# Patient Record
Sex: Female | Born: 1985 | Race: White | Hispanic: No | Marital: Single | Smoking: Never smoker
Health system: Southern US, Community
[De-identification: ages and names within clinical notes are randomized; demographics above are authoritative.]

## PROBLEM LIST (undated history)

## (undated) DIAGNOSIS — Q213 Tetralogy of Fallot: Secondary | ICD-10-CM

## (undated) HISTORY — PX: CARDIAC SURGERY: SHX584

---

## 2013-08-17 ENCOUNTER — Emergency Department
Admission: EM | Admit: 2013-08-17 | Discharge: 2013-08-17 | Disposition: A | Discharge status: Home / Self Care | Payer: BC Managed Care – PPO | Source: Home / Self Care | Attending: Emergency Medicine | Admitting: Emergency Medicine

## 2013-08-17 ENCOUNTER — Encounter: Payer: Self-pay | Admitting: Emergency Medicine

## 2013-08-17 DIAGNOSIS — L0201 Cutaneous abscess of face: Secondary | ICD-10-CM

## 2013-08-17 DIAGNOSIS — K047 Periapical abscess without sinus: Secondary | ICD-10-CM

## 2013-08-17 HISTORY — DX: Tetralogy of Fallot: Q21.3

## 2013-08-17 MED ORDER — AMOXICILLIN-POT CLAVULANATE 875-125 MG PO TABS: 1.0000 | ORAL_TABLET | Freq: Two times a day (BID) | ORAL | Status: DC

## 2013-08-17 MED ORDER — CEFTRIAXONE SODIUM 1 G IJ SOLR
1.0000 g | INTRAMUSCULAR | Status: AC
  Administered 2013-08-17: 1 g via INTRAMUSCULAR

## 2013-08-17 NOTE — ED Provider Notes (Addendum)
CSN: [id number]     Arrival date & time 08/17/13  1206 History   First MD Initiated Contact with Patient 08/17/13 1249     Chief Complaint  Patient presents with  . Dental Pain   HPI 27 year old female,  from [place], visiting [place] with family.  Reports onset of pain and swelling in upper left anterior  jaw x 3 days. She feels that she has abscessed tooth. It is Friday during the holiday week, and no dentist available.  No OTCs since yesterday.-- She's tried saltwater gargles. Denies drainage or bleeding. Denies fever, chills, chest pain, shortness of breath. She denies chance of pregnancy, LMP normal about 2 weeks ago. She has IUD in place. She is allergic to Vicodin which caused "weird symptoms" when she took this in the past for acute postop pain. She states she's not seeking any controlled drugs pain medication. The pain is moderate intensity.  Ibuprofen or Tylenol helps somewhat.  Past Medical History  Diagnosis Date  . TOF (tetralogy of Fallot)    Past Surgical History  Procedure Laterality Date  . Cardiac surgery      x3   History reviewed. No pertinent family history. History  Substance Use Topics  . Smoking status: Never Smoker   . Smokeless tobacco: Not on file  . Alcohol Use: Yes   OB History   Grav Para Term Preterm Abortions TAB SAB Ect Mult Living                 Review of Systems  Constitutional: Negative for fever, chills and fatigue.  HENT: Negative for congestion, rhinorrhea, sinus pressure, sore throat and trouble swallowing.   Respiratory: Negative for cough, chest tightness and wheezing.   Cardiovascular: Negative for chest pain, palpitations and leg swelling.  Gastrointestinal: Negative.   Genitourinary: Negative.   Neurological: Negative.   Psychiatric/Behavioral: Negative.   All other systems reviewed and are negative.    Allergies  Vicodin  Home Medications   Current Outpatient Rx  Name  Route  Sig  Dispense  Refill  .  amoxicillin-clavulanate (AUGMENTIN) 875-125 MG per tablet   Oral   Take 1 tablet by mouth every 12 (twelve) hours. Take for 10 days. Take with food.   20 tablet   0    BP 102/66  Pulse 88  Temp(Src) 98.7 F (37.1 C) (Oral)  Resp 16  Ht 5\' 3"  (1.6 m)  Wt 125 lb (56.7 kg)  BMI 22.15 kg/m2  SpO2 100% Physical Exam  Nursing note and vitals reviewed. Constitutional: She is oriented to person, place, and time. She appears well-developed and well-nourished. She is cooperative.  Non-toxic appearance. No distress.  HENT:  Head: Normocephalic and atraumatic.  Right Ear: Tympanic membrane, external ear and ear canal normal.  Left Ear: Tympanic membrane, external ear and ear canal normal.  Nose: Nose normal. Right sinus exhibits no maxillary sinus tenderness and no frontal sinus tenderness. Left sinus exhibits no maxillary sinus tenderness and no frontal sinus tenderness.  Mouth/Throat: Mucous membranes are normal. No oropharyngeal exudate, posterior oropharyngeal edema, posterior oropharyngeal erythema or tonsillar abscesses.    There is induration, redness, mild swelling as depicted. No fluctuance or drainage.  Eyes: Conjunctivae are normal. No scleral icterus.  Neck: Neck supple.  Cardiovascular: Normal rate and regular rhythm.   Murmur heard.  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  Extremities: No cyanosis, clubbing, or edema  Pulmonary/Chest: Effort normal and breath sounds normal. No stridor. No respiratory distress. She has no wheezes. She has no rales.  Musculoskeletal: She exhibits no edema.  Lymphadenopathy:       Head (left side): Submandibular adenopathy present.    She has cervical adenopathy.       Right cervical: No superficial cervical, no deep cervical and no posterior cervical adenopathy present.      Left cervical: Superficial cervical adenopathy  present. No deep cervical and no posterior cervical adenopathy present.  Neurological: She is alert and oriented to person, place, and time.  Skin: Skin is warm and dry. No rash noted.  No red streaks  Psychiatric: She has a normal mood and affect.    ED Course  Procedures (including critical care time) Labs Review Labs Reviewed - No data to display Imaging Review No results found.  EKG Interpretation    Date/Time:    Ventricular Rate:    PR Interval:    QRS Duration:   QT Interval:    QTC Calculation:   R Axis:     Text Interpretation:              MDM   1. Cellulitis and abscess of face   2. Dental abscess    Risks, benefits, alternatives discussed Rocephin 1 g IM stat. Augmentin 875 twice a day x10 days. She is not requesting any prescription pain medication. Advised ibuprofen OTC 600 mg every 6-8 hours with food for pain, as this has been effective. Continue salt water rinses . Warm compresses. No dentist office or oral surgeon office in this region is open today  (Fri 12/26).   I advised that she needs to followup with an oral surgeon or ENT within 3-5 days.  Names and phone numbers given. --- Red flags discussed. Go to ER stat if any severe or worsening symptoms. Precautions discussed. Red flags discussed. Questions invited and answered. Patient voiced understanding and agreement.    Lajean Manes, MD 08/17/13 1603  Lajean Manes, MD 08/17/13 [phone]

## 2013-08-17 NOTE — ED Notes (Signed)
Reports onset of pain in upper left jaw x 3 days ago; believes there is an abscessed tooth there. Is from out of town with no dental care available. No OTCs since yesterday.

## 2014-01-23 ENCOUNTER — Ambulatory Visit (INDEPENDENT_AMBULATORY_CARE_PROVIDER_SITE_OTHER): Payer: BC Managed Care – PPO | Admitting: Cardiovascular Disease

## 2014-01-23 ENCOUNTER — Encounter: Payer: Self-pay | Admitting: Cardiovascular Disease

## 2014-01-23 ENCOUNTER — Telehealth: Payer: Self-pay | Admitting: *Deleted

## 2014-01-23 VITALS — BP 122/76 | HR 84 | Ht 62.0 in | Wt 143.9 lb

## 2014-01-23 DIAGNOSIS — Z9889 Other specified postprocedural states: Secondary | ICD-10-CM

## 2014-01-23 DIAGNOSIS — E785 Hyperlipidemia, unspecified: Secondary | ICD-10-CM

## 2014-01-23 DIAGNOSIS — Z954 Presence of other heart-valve replacement: Secondary | ICD-10-CM

## 2014-01-23 DIAGNOSIS — R5381 Other malaise: Secondary | ICD-10-CM

## 2014-01-23 DIAGNOSIS — G4710 Hypersomnia, unspecified: Secondary | ICD-10-CM

## 2014-01-23 DIAGNOSIS — I4510 Unspecified right bundle-branch block: Secondary | ICD-10-CM

## 2014-01-23 DIAGNOSIS — Z79899 Other long term (current) drug therapy: Secondary | ICD-10-CM

## 2014-01-23 DIAGNOSIS — R5383 Other fatigue: Secondary | ICD-10-CM

## 2014-01-23 DIAGNOSIS — Z952 Presence of prosthetic heart valve: Secondary | ICD-10-CM

## 2014-01-23 DIAGNOSIS — Q213 Tetralogy of Fallot: Secondary | ICD-10-CM

## 2014-01-23 NOTE — Telephone Encounter (Signed)
Faxed [place] release of information to Dr. Laray Anger [facility] [place] to receive patient records.

## 2014-01-23 NOTE — Patient Instructions (Addendum)
Your physician recommends that you schedule a follow-up appointment in: 2 Months  Your physician has requested that you have an echocardiogram. Echocardiography is a painless test that uses sound waves to create images of your heart. It provides your doctor with information about the size and shape of your heart and how well your heart's chambers and valves are working. This procedure takes approximately one hour. There are no restrictions for this procedure.  A chest x-ray takes a picture of the organs and structures inside the chest, including the heart, lungs, and blood vessels. This test can show several things, including, whether the heart is enlarges; whether fluid is building up in the lungs; and whether pacemaker / defibrillator leads are still in place.  Your physician recommends that you return for lab work Fasting Blood work

## 2014-01-29 ENCOUNTER — Telehealth (HOSPITAL_COMMUNITY): Payer: Self-pay | Admitting: *Deleted

## 2014-02-08 ENCOUNTER — Ambulatory Visit (INDEPENDENT_AMBULATORY_CARE_PROVIDER_SITE_OTHER): Payer: BC Managed Care – PPO | Admitting: Family Medicine

## 2014-02-08 ENCOUNTER — Encounter: Payer: Self-pay | Admitting: Family Medicine

## 2014-02-08 VITALS — BP 112/65 | HR 91 | Ht 62.0 in | Wt 143.0 lb

## 2014-02-08 DIAGNOSIS — R0683 Snoring: Secondary | ICD-10-CM

## 2014-02-08 DIAGNOSIS — F411 Generalized anxiety disorder: Secondary | ICD-10-CM | POA: Insufficient documentation

## 2014-02-08 DIAGNOSIS — R0609 Other forms of dyspnea: Secondary | ICD-10-CM

## 2014-02-08 DIAGNOSIS — L819 Disorder of pigmentation, unspecified: Secondary | ICD-10-CM

## 2014-02-08 DIAGNOSIS — R0989 Other specified symptoms and signs involving the circulatory and respiratory systems: Secondary | ICD-10-CM

## 2014-02-08 DIAGNOSIS — Q213 Tetralogy of Fallot: Secondary | ICD-10-CM

## 2014-02-08 MED ORDER — ESCITALOPRAM OXALATE 5 MG PO TABS: 5.0000 mg | ORAL_TABLET | Freq: Every day | ORAL | Status: DC

## 2014-02-08 NOTE — Progress Notes (Signed)
CC: Stuti Loretta PlumeKennard is a 28 y.o. female is here for Establish Care   Subjective: HPI:  "Heidi Spencer" like "Santina EvansCatherine"  Very pleasant 28 year old here to establish care.  Complains of subjective anxiety that is described as mild to moderate in severity. She describes it as a uneasiness when around new environments. Also accompanied by a strong urge to go over lists that she already knows has committed to memory however she gets anxious that this possibly something she missed.  Also described as anxious this while driving. States his symptoms in the way of her quality of life, have been present for years, nothing particularly makes better or worse other than above. She was on anxiety medication when younger her she forgets what it was.  She denies obsessions, compulsions other than that described above, depression, nor any other mental disturbance  Has a history of tetralogy of Fallot.  She has already establish [place] cardi[place] in [place]. She denies exertional chest pain, orthopnea, peripheral edema. She tells me she does get nonrestorative sleep and is a snorer. She has never had any witnessed apneic episodes.  Reports a mole on the back of her left shoulder has been present for matter of years she's unsure whether or not it is getting bigger or smaller. No personal or family history of skin cancer. She has never had it checked by medical professional before.  Review of Systems - General ROS: negative for - chills, fever, night sweats, weight gain or weight loss Ophthalmic ROS: negative for - decreased vision Psychological ROS: negative for -  depression ENT ROS: negative for - hearing change, nasal congestion, tinnitus or allergies Hematological and Lymphatic ROS: negative for - bleeding problems, bruising or swollen lymph nodes Breast ROS: negative Respiratory ROS: no cough, shortness of breath, or wheezing Cardiovascular ROS: no chest pain or dyspnea on  exertion Gastrointestinal ROS: no abdominal pain, change in bowel habits, or black or bloody stools Genito-Urinary ROS: negative for - genital discharge, genital ulcers, incontinence or abnormal bleeding from genitals Musculoskeletal ROS: negative for - joint pain or muscle pain Neurological ROS: negative for - headaches or memory loss Dermatological ROS: negative for lumps, mole changes, rash and skin lesion changes other than that described above  Past Medical History  Diagnosis Date  . TOF (tetralogy of Fallot)     Past Surgical History  Procedure Laterality Date  . Cardiac surgery      x3 [age]yr, 28 yrs old and 28 yrs old   Family History  Problem Relation Age of Onset  . Crohn's disease Father   . Congestive Heart Failure Paternal Grandfather     History   Social History  . Marital Status: Single    Spouse Name: N/A    Number of Children: N/A  . Years of Education: N/A   Occupational History  . Not on file.   Social History Main Topics  . Smoking status: Never Smoker   . Smokeless tobacco: Never Used  . Alcohol Use: Yes  . Drug Use: No  . Sexual Activity: Not on file   Other Topics Concern  . Not on file   Social History Narrative  . No narrative on file     Objective: BP 112/65  Pulse 91  Ht 5\' 2"  (1.575 m)  Wt 143 lb (64.864 kg)  BMI 26.15 kg/m2   General: Alert and Oriented, No Acute Distress HEENT: Pupils equal, round, reactive to light. Conjunctivae clear. Moist mucous membranes pharynx unremarkable Lungs: Clear to  auscultation bilaterally, no wheezing/ronchi/rales.  Comfortable work of breathing. Good air movement. Cardiac: Regular rate and rhythm. Normal S1/S2.  No murmurs, rubs, nor gallops.   Extremities: No peripheral edema.  Strong peripheral pulses.  Mental Status: No depression, anxiety, nor agitation. Skin: Warm and dry. 4 mm diameter circular, symmetric, sharply bordered, single color pigmented lesion on the left shoulder,  flat.  Assessment & Plan: Valene was seen today for establish care.  Diagnoses and associated orders for this visit:  Tetralogy of Fallot  Pigmented skin lesion  Generalized anxiety disorder - escitalopram (LEXAPRO) 5 MG tablet; Take 1 tablet (5 mg total) by mouth daily.  Snoring    Pigmented skin lesion: Does not meet abcd criteria for biopsy at this time, we will follow at future visits Generalized anxiety disorder: Start low-dose Lexapro Snoring: I've asked her to ask her partner to watch her sleeping sometime in the near future to look for any apnea episodes.  Return in about 4 weeks (around 03/08/2014) for Mood Follow Up.

## 2014-02-16 ENCOUNTER — Encounter: Payer: Self-pay | Admitting: Cardiovascular Disease

## 2014-02-16 DIAGNOSIS — G4710 Hypersomnia, unspecified: Secondary | ICD-10-CM | POA: Insufficient documentation

## 2014-02-16 DIAGNOSIS — Z952 Presence of prosthetic heart valve: Secondary | ICD-10-CM | POA: Insufficient documentation

## 2014-02-16 DIAGNOSIS — I4510 Unspecified right bundle-branch block: Secondary | ICD-10-CM | POA: Insufficient documentation

## 2014-02-16 NOTE — Progress Notes (Signed)
Patient ID: Heidi Spencer, female   DOB: 10-03-1985, 28 y.o.   MRN: [id number]     PATIENT PROFILE: Heidi Spencer is a 28 y.o. female who presents to the office today to establish cardiology care with me.  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   HPI:  Heidi Spencer is a 28 y.o. 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 She tells me she underwent 3 open heart surgical procedures.  The first procedure was when she was [age] year old, the second procedure at age 89, and her last operation at age 28.  [facility]  She was cared for by a Dr. Marland KitchenHussayni as well as Dr. Fredderick PhenixJoel Hardin.  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  I do not have the specifics of any of her heart surgeries.  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  She now presents to the office to establish cardiology care.  Presently, she denies chest pain. She is able to walk 7 days per week.  She denies clear-cut exertional shortness of breath, but does admit to experiencing episodes of random, shortness of breath.  There is no history of palpitations, presyncope, or syncope.   She does admit to hypersomnolence.  She notes rare snoring.   Past Medical History  Diagnosis Date  . TOF (tetralogy of Fallot)     Past Surgical History  Procedure Laterality Date  . Cardiac surgery      x3 [age]yr, 28 yrs old and 28 yrs old    Allergies  Allergen Reactions  . Vicodin [Hydrocodone-Acetaminophen]     Pt states it makes her breathing irregular    Current Outpatient Prescriptions  Medication Sig Dispense Refill  .  escitalopram (LEXAPRO) 5 MG tablet Take 1 tablet (5 mg total) by mouth daily.  30 tablet  2   No current facility-administered medications for this visit.    Socially, she is single and has 2 children, ages 884 and 3916 months.  Family History  Problem Relation Age of Onset  . Crohn's disease Father   . Congestive Heart Failure Paternal Grandfather   . Depression      ROS General: Negative; No fevers, chills, or night sweats HEENT: Negative; No changes in vision or hearing, sinus congestion, difficulty swallowing Pulmonary: Negative; No cough, wheezing, shortness of breath, hemoptysis Cardiovascular:  See HPI; No chest pain, presyncope, syncope, palpitations, edema GI: Negative; No nausea, vomiting, diarrhea, or abdominal pain GU: Negative; No dysuria, hematuria, or difficulty voiding Musculoskeletal: Negative; no myalgias, joint pain, or weakness Hematologic/Oncologic: Negative; no easy bruising, bleeding Endocrine: Negative; no heat/cold intolerance; no diabetes Neuro: Negative; no changes in balance, headaches Skin: Negative; No rashes or skin lesions Psychiatric: Negative; No behavioral problems, depression Sleep: Positive for hypersomnolence.  She typically goes to bed at 10:30 PM and wakes up at 6 AM. bruxism, restless legs, hypnogagnic hallucinations Other comprehensive 14 point system review is negative   Physical Exam BP 122/76  Pulse 84  Ht 5\' 2"  (1.575 m)  Wt 143 lb 14.4 oz (65.273 kg)  BMI 26.31 kg/m2 General: Alert, oriented, no distress.  Skin: normal turgor, no rashes, warm and dry HEENT: Normocephalic, atraumatic. Pupils equal round and reactive to light; sclera anicteric; extraocular muscles intact; Fundi without hemorrhages or exudates Nose without nasal septal hypertrophy Mouth/Parynx benign; Mallinpatti scale 3 Neck: Transmitted murmur to her neck;  No JVD, no carotid bruits; normal carotid upstroke Lungs: clear to ausculatation and percussion; no wheezing  or rales Chest wall: without tenderness to palpitation Heart: PMI not displaced, RRR, s1 s2 normal, 4/6 systolic murmur along the left sternal border with transmitted murmurs to her left neck, no diastolic murmur, no rubs, gallops, thrills, or heaves Abdomen: soft, nontender; no hepatosplenomehaly, BS+; abdominal aorta nontender and not dilated by palpation. Back: no CVA tenderness Pulses 2+ Musculoskeletal: full range of motion, normal strength, no joint deformities Extremities: no clubbing cyanosis or edema, Homan's sign negative  Neurologic: grossly nonfocal; Cranial nerves grossly wnl Psychologic: Normal mood and affect   ECG (independently read by me): Normal sinus rhythm at 84 beats per minute.  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  QTc interval 479 ms.  LABS:  BMET No results found for this basename: na, k, cl, co2, glucose, bun, creatinine, calcium, gfrnonaa, gfraa     Hepatic Function Panel  No results found for this basename: prot, albumin, ast, alt, alkphos, bilitot, bilidir, ibili     CBC No results found for this basename: wbc, rbc, hgb, hct, plt, mcv, mch, mchc, rdw, neutrabs, lymphsabs, monoabs, eosabs, basosabs     BNP No results found for this basename: probnp    Lipid Panel  No results found for this basename: chol, trig, hdl, cholhdl, vldl, ldlcalc      RADIOLOGY: No results found.   ASSESSMENT AND PLAN: Heidi Spencer is a very pleasant 28 year old female who has a complex congenital heart disease history and was born with Tetralogy of Fallot heart defect.  I need to obtain records of her prior hospitalizations and surgeries.  However, in speaking with her she has had 3 open heart surgical procedures and had a Blalock shunt.  She subsequently underwent  pulmonic valve replacement and had a bovine pericardial valve replaced at age 28, which is 12 years ago.  We will contact [facility] to obtain records from her children's  hospitalizations and also will contact Dr. Dorethea ClanHardin who was her adult congenital heart physician. I am scheduling her for a 2-D echo Doppler study to obtain a baseline and will need to compare this present study to her prior evaluations in [place].  A complete set of blood work will be obtained consisting of a CBC, comprehensive metabolic panel, TSH, vitamin D level, and lipid panel.  I am scheduling her for a PA and lateral chest x-ray. She does have hypersomnolence and Epworth Sleepiness Scale score today was calculated and was significantly elevated at 19.  She is unaware of experiencing any cataplectic spells and is unaware of any history of narcolepsy.  I will see her back in followup of the above studies and further recommendations will be made at that time.  Lennette Biharihomas A. Kelly, MD, [facility] 02/16/2014 2:30 PM

## 2014-02-18 ENCOUNTER — Ambulatory Visit (HOSPITAL_COMMUNITY)
Admission: RE | Admit: 2014-02-18 | Discharge: 2014-02-18 | Disposition: A | Discharge status: Home / Self Care | Payer: BC Managed Care – PPO | Source: Ambulatory Visit | Attending: Cardiology | Admitting: Cardiology

## 2014-02-18 DIAGNOSIS — Z9889 Other specified postprocedural states: Secondary | ICD-10-CM

## 2014-02-18 DIAGNOSIS — Q213 Tetralogy of Fallot: Secondary | ICD-10-CM | POA: Insufficient documentation

## 2014-02-18 DIAGNOSIS — I359 Nonrheumatic aortic valve disorder, unspecified: Secondary | ICD-10-CM

## 2014-02-18 NOTE — Progress Notes (Signed)
2D Echo Performed 02/18/2014    Tammie Crouch, RCS  

## 2014-03-07 ENCOUNTER — Telehealth: Payer: Self-pay | Admitting: *Deleted

## 2014-03-07 NOTE — Telephone Encounter (Signed)
Message copied by Marella BileVOGEL, Laurine Kuyper W. on Thu Mar 07, 2014  2:32 PM ------      Message from: Nicki GuadalajaraKELLY, THOMAS A      Created: Fri Mar 01, 2014 10:13 AM       Need to obtain old studies from [facility] to compare; sig inc PA presure at 79 mm; EF 45-50% ------

## 2014-03-07 NOTE — Telephone Encounter (Signed)
I spoke with patient regarding echo results.  She informed me that records have already been requested from [facility]  We will await those results.  Message sent to [facility] for tracking.

## 2014-03-19 ENCOUNTER — Ambulatory Visit (INDEPENDENT_AMBULATORY_CARE_PROVIDER_SITE_OTHER): Payer: BC Managed Care – PPO | Admitting: Physician Assistant

## 2014-03-19 ENCOUNTER — Encounter: Payer: Self-pay | Admitting: Physician Assistant

## 2014-03-19 VITALS — BP 104/66 | HR 86 | Temp 98.1°F | Ht 62.0 in | Wt 142.0 lb

## 2014-03-19 DIAGNOSIS — J069 Acute upper respiratory infection, unspecified: Secondary | ICD-10-CM | POA: Diagnosis not present

## 2014-03-19 MED ORDER — AZITHROMYCIN 250 MG PO TABS: ORAL_TABLET | ORAL | Status: DC

## 2014-03-19 NOTE — Progress Notes (Signed)
   Subjective:    Patient ID: Heidi Spencer, female    DOB: 29-Feb-1986, 28 y.o.   MRN: [id number]  HPI Pt is a 28 yo female who presents to the clinic with ST, headache, dizziness, cough and loss of voice for 6 days. She has regained her voice back. Cough is productive with whitish yellow sputum. No fever. Felt warm then cold this am. Daughter as bilateral ear infection. Not tried anything OTC. Has had some itchy eyes. No nausea, vomiting, diarrhea. She is living with a cancer patient and needs to get well. She does feel a little short of breath and like her chest is tight.    Review of Systems  All other systems reviewed and are negative.      Objective:   Physical Exam  Constitutional: She is oriented to person, place, and time. She appears well-developed and well-nourished.  HENT:  Head: Normocephalic and atraumatic.  Right Ear: External ear normal.  Left Ear: External ear normal.  Nose: Nose normal.  Mouth/Throat: Oropharynx is clear and moist.  TM bilateral slight bulge with what appears to be fluid behind TM.   Negative for maxillary and frontal sinus tenderness.   Bilateral nasal turbinates red and swollen.   Eyes: Conjunctivae are normal. Right eye exhibits no discharge. Left eye exhibits no discharge.  Neck: Normal range of motion. Neck supple.  Bilateral cervical adenopathy, non tender.   Cardiovascular: Normal rate and regular rhythm.   Holosystolic ejection murmur 4/6. Hx of tetralology of fallot.    Pulmonary/Chest: Effort normal and breath sounds normal. She has no wheezes.  Lymphadenopathy:    She has cervical adenopathy.  Neurological: She is alert and oriented to person, place, and time.  Skin: Skin is warm and dry.  Psychiatric: She has a normal mood and affect. Her behavior is normal.          Assessment & Plan:  URI- due to duration and sick contacts will treat for zpak. Infection does seem to be progressing to bacterial from likely viral. Consider  adding regular mucinex bid. HO given for other symptomatic control of symptoms. Follow up if not improving or if worsening.

## 2014-03-19 NOTE — Patient Instructions (Signed)
mucinex twice a day.   Upper Respiratory Infection, Adult An upper respiratory infection (URI) is also known as the common cold. It is often caused by a type of germ (virus). Colds are easily spread (contagious). You can pass it to others by kissing, coughing, sneezing, or drinking out of the same glass. Usually, you get better in 1 or 2 weeks.  HOME CARE   Only take medicine as told by your doctor.  Use a warm mist humidifier or breathe in steam from a hot shower.  Drink enough water and fluids to keep your pee (urine) clear or pale yellow.  Get plenty of rest.  Return to work when your temperature is back to normal or as told by your doctor. You may use a face mask and wash your hands to stop your cold from spreading. GET HELP RIGHT AWAY IF:   After the first few days, you feel you are getting worse.  You have questions about your medicine.  You have chills, shortness of breath, or brown or red spit (mucus).  You have yellow or brown snot (nasal discharge) or pain in the face, especially when you bend forward.  You have a fever, puffy (swollen) neck, pain when you swallow, or white spots in the back of your throat.  You have a bad headache, ear pain, sinus pain, or chest pain.  You have a high-pitched whistling sound when you breathe in and out (wheezing).  You have a lasting cough or cough up blood.  You have sore muscles or a stiff neck. MAKE SURE YOU:   Understand these instructions.  Will watch your condition.  Will get help right away if you are not doing well or get worse. Document Released: 01/26/2008 Document Revised: 11/01/2011 Document Reviewed: 11/14/2013 [facility] Patient Information 2015 [employer] [place]. This information is not intended to replace advice given to you by your health care provider. Make sure you discuss any questions you have with your health care provider.

## 2014-03-20 ENCOUNTER — Telehealth: Payer: Self-pay | Admitting: *Deleted

## 2014-03-20 NOTE — Telephone Encounter (Signed)
Called [facility] to inquire about the patient records that was requested on June 3rd. Spoke with Environmental health practitioneradministrative assistant who informs me that the request was sent to the physician and she will follow up on this and call me back to give status update.

## 2014-03-21 NOTE — Telephone Encounter (Signed)
RECORDS RECEIVED.

## 2014-03-22 ENCOUNTER — Ambulatory Visit: Payer: BC Managed Care – PPO | Admitting: Family Medicine

## 2014-03-22 DIAGNOSIS — Z0289 Encounter for other administrative examinations: Secondary | ICD-10-CM

## 2014-04-16 NOTE — Progress Notes (Signed)
Records received from [facility] and given to Dr. Tresa Endo for review.

## 2014-04-22 ENCOUNTER — Ambulatory Visit: Payer: BC Managed Care – PPO | Admitting: Cardiovascular Disease

## 2014-05-03 ENCOUNTER — Ambulatory Visit
Admission: RE | Admit: 2014-05-03 | Discharge: 2014-05-03 | Disposition: A | Discharge status: Home / Self Care | Payer: BC Managed Care – PPO | Source: Ambulatory Visit | Attending: Cardiovascular Disease | Admitting: Cardiovascular Disease

## 2014-05-03 DIAGNOSIS — Q213 Tetralogy of Fallot: Secondary | ICD-10-CM

## 2014-05-03 DIAGNOSIS — Z9889 Other specified postprocedural states: Secondary | ICD-10-CM

## 2014-05-03 LAB — COMPREHENSIVE METABOLIC PANEL
ALBUMIN: 4.5 g/dL (ref 3.5–5.2)
ALK PHOS: 75 U/L (ref 39–117)
ALT: 12 U/L (ref 0–35)
AST: 16 U/L (ref 0–37)
BUN: 10 mg/dL (ref 6–23)
CALCIUM: 9 mg/dL (ref 8.4–10.5)
CHLORIDE: 105 meq/L (ref 96–112)
CO2: 28 mEq/L (ref 19–32)
Creat: 0.69 mg/dL (ref 0.50–1.10)
Glucose, Bld: 85 mg/dL (ref 70–99)
POTASSIUM: 4.1 meq/L (ref 3.5–5.3)
SODIUM: 138 meq/L (ref 135–145)
TOTAL PROTEIN: 6.9 g/dL (ref 6.0–8.3)
Total Bilirubin: 0.6 mg/dL (ref 0.2–1.2)

## 2014-05-03 LAB — CBC
HCT: 39.1 % (ref 36.0–46.0)
Hemoglobin: 12.9 g/dL (ref 12.0–15.0)
MCH: 28 pg (ref 26.0–34.0)
MCHC: 33 g/dL (ref 30.0–36.0)
MCV: 85 fL (ref 78.0–100.0)
PLATELETS: 265 10*3/uL (ref 150–400)
RBC: 4.6 MIL/uL (ref 3.87–5.11)
RDW: 14.1 % (ref 11.5–15.5)
WBC: 6.2 10*3/uL (ref 4.0–10.5)

## 2014-05-03 LAB — LIPID PANEL
Cholesterol: 114 mg/dL (ref 0–200)
HDL: 43 mg/dL (ref >39)
LDL Cholesterol: 63 mg/dL (ref 0–99)
Total CHOL/HDL Ratio: 2.7 Ratio
Triglycerides: 39 mg/dL (ref <150)
VLDL: 8 mg/dL (ref 0–40)

## 2014-05-03 LAB — TSH: TSH: 2.068 u[IU]/mL (ref 0.350–4.500)

## 2014-05-04 LAB — VITAMIN D 25 HYDROXY (VIT D DEFICIENCY, FRACTURES): Vit D, 25-Hydroxy: 45 ng/mL (ref 30–89)

## 2014-05-06 ENCOUNTER — Ambulatory Visit (INDEPENDENT_AMBULATORY_CARE_PROVIDER_SITE_OTHER): Payer: BC Managed Care – PPO | Admitting: Cardiovascular Disease

## 2014-05-06 ENCOUNTER — Encounter: Payer: Self-pay | Admitting: Cardiovascular Disease

## 2014-05-06 VITALS — BP 100/60 | HR 73 | Ht 63.0 in | Wt 144.6 lb

## 2014-05-06 DIAGNOSIS — Z952 Presence of prosthetic heart valve: Secondary | ICD-10-CM

## 2014-05-06 DIAGNOSIS — Z954 Presence of other heart-valve replacement: Secondary | ICD-10-CM

## 2014-05-06 DIAGNOSIS — I379 Nonrheumatic pulmonary valve disorder, unspecified: Secondary | ICD-10-CM

## 2014-05-06 DIAGNOSIS — I370 Nonrheumatic pulmonary valve stenosis: Secondary | ICD-10-CM

## 2014-05-06 DIAGNOSIS — G4710 Hypersomnia, unspecified: Secondary | ICD-10-CM

## 2014-05-06 DIAGNOSIS — Q213 Tetralogy of Fallot: Secondary | ICD-10-CM

## 2014-05-06 NOTE — Progress Notes (Signed)
Patient ID: Heidi Spencer, female   DOB: 1985/10/31, 28 y.o.   MRN: [id number]     PATIENT PROFILE: Heidi Spencer is a 28 y.o. female who I saw for initial evaluation, and June 2015.  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   HPI:  Heidi Spencer is a 28 y.o. 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 She tells me she underwent 3 open heart surgical procedures.  The first procedure was when she was [age] year old, the second procedure at age 11, and her last operation at age 47.  She was cared for at [facility] at [facility].  She was cared for by a Dr. Marland Kitchen as well as Dr. Fredderick Phenix.  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   When I initially saw her, I did not have any records from [facility].  She had seen Dr. Dorethea Clan who was the director of the division of pediatric cardiology and adult congenital heart disease clinic at [facility].  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  On 02/23/1988, she underwent left thoracotomy, left modified left Gailen Shelter shunt procedure and ligation of aortopulmonary collateral to the left lung.  On 09/28/1988 she underwent right thoracotomy, ligation of 3 aortopulmonary collaterals taking origin from the right subclavian artery, right modified Blalock-Taussig shunt procedure (5 mm Gore-Tex).  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 " On 11/03/1989.  She  underwent a median sternotomy and the previous aortopulmonary shunts and additional major pulmonary collaterals were ligated.  The VSD was closed with a Gore-Tex patch 14 mm, RV, PA, aortic homograft conduit.  On 03/08/1994 at cardiac catheterization she underwent transcatheter coil occlusion of the aortopulmonary collaterals taking origin from the right carotid artery. On 11/11/1993.  She underwent median sternotomy and revision of previously implanted.  RV to PA conduit incorporating a 25 mm Carpentier-Edwards bovine pericardial valve.  On 07/06/2002 she underwent a median sternotomy and pulmonary valve replacement with a 29 mm Carpentier-Edwards bovine pericardial valve prosthesis.  Her last echo Doppler study done at [facility] prior to moving to [facility] in January 2015 showed a mildly dilated.  Right ventricle with normal right ventricular systolic and left ventricular systolic function.  She had an intact ventricular septum, but the septal contour was flat during systole, consistent with increased right ventricular pressure.  She had mild tricuspid valve regurgitation.  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  Her pulmonic valve peak gradient was 41.8 mm with a mean gradient of 25.6 mm.  There was moderate holes diastolic pulmonic valve regurgitation, mild aortic valve regurgitation .compared to a prior study her gradients had increased across the bioprosthetic valve.    When I initially saw Heidi Spencer, I did not have the above data.  However, she was complaining of significant fatigue and also was having some hypersomnolence.  I scheduled her for an echo  Doppler study here in [place].  This revealed an ejection fraction at 45-50%.  There was mild aortic regurgitation.  On this echo, her PA systolic pressure was severely increased at 79 mm.  She presents for followup evaluation.    Past Medical History    Diagnosis Date  . TOF (tetralogy of Fallot)     Past Surgical History  Procedure Laterality Date  . Cardiac surgery      x3 65yr, 28 yrs old and 28 yrs old    Allergies  Allergen Reactions  . Vicodin [Hydrocodone-Acetaminophen]     Pt states it makes her breathing irregular    Current Outpatient Prescriptions  Medication Sig Dispense Refill  . escitalopram (LEXAPRO) 5 MG tablet Take 1 tablet (5 mg total) by mouth daily.  30 tablet  2   No current facility-administered medications for this visit.    Socially, she is single and has 2 children, ages 58 and 81 months.  Family History  Problem Relation Age of Onset  . Crohn's disease Father   . Congestive Heart Failure Paternal Grandfather   . Depression      ROS General: Negative; No fevers, chills, or night sweats; positive for fatigue  HEENT: Negative; No changes in vision or hearing, sinus congestion, difficulty swallowing Pulmonary: Negative; No cough, wheezing, shortness of breath, hemoptysis Cardiovascular:  See HPI; No chest pain, presyncope, syncope, palpitations, edema GI: Negative; No nausea, vomiting, diarrhea, or abdominal pain GU: Negative; No dysuria, hematuria, or difficulty voiding Musculoskeletal: Negative; no myalgias, joint pain, or weakness Hematologic/Oncologic: Negative; no easy bruising, bleeding Endocrine: Negative; no heat/cold intolerance; no diabetes Neuro: Negative; no changes in balance, headaches Skin: Negative; No rashes or skin lesions Psychiatric: Negative; No behavioral problems, depression Sleep: Positive for hypersomnolence.  She typically goes to bed at 10:30 PM and wakes up at 6 AM. bruxism, restless legs, hypnogagnic hallucinations Other comprehensive 14 point system review is negative   Physical Exam BP 100/60  Pulse 73  Ht  (1.6 m)  Wt 144 lb 9.6 oz (65.59 kg)  BMI 25.62 kg/m2 General: Alert, oriented, no distress.  Skin: normal turgor, no rashes, warm and dry HEENT:  Normocephalic, atraumatic. Pupils equal round and reactive to light; sclera anicteric; extraocular muscles intact; Fundi without hemorrhages or exudates Nose without nasal septal hypertrophy Mouth/Parynx benign; Mallinpatti scale 3 Neck: Transmitted murmur to her neck;  No JVD, no carotid bruits; normal carotid upstroke Lungs: clear to ausculatation and percussion; no wheezing or rales Chest wall: without tenderness to palpitation Heart: PMI not displaced, RRR, s1 s2 normal, 4/6 systolic murmur along the left sternal border with transmitted murmurs to her left neck, 1/6 diastolic murmur, no rubs, gallops, thrills, or heaves Abdomen: soft, nontender; no hepatosplenomehaly, BS+; abdominal aorta nontender and not dilated by palpation. Back: no CVA tenderness Pulses 2+ Musculoskeletal: full range of motion, normal strength, no joint deformities Extremities: no clubbing cyanosis or edema, Homan's sign negative  Neurologic: grossly nonfocal; Cranial nerves grossly wnl Psychologic: Normal mood and affect    ECG (and apparently read by me): Normal sinus rhythm at 73 beats per minute.  PR interval 158 Heidi.  QT interval 427 Heidi.  Small nondiagnostic Q waves in leads II, III, and F.   01/23/2014 ECG (independently read by me): Normal sinus rhythm at 84 beats per minute.  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  QTc interval 479 Heidi.  LABS:  BMET    Component Value Date/Time   NA 138 05/03/2014 0759  Hepatic Function Panel     Component Value Date/Time   PROT 6.9 05/03/2014 0759     CBC    Component Value Date/Time   WBC 6.2 05/03/2014 0759     BNP No results found for this basename: probnp    Lipid Panel     Component Value Date/Time   CHOL 114 05/03/2014 0759      RADIOLOGY: No results found.   ASSESSMENT AND PLAN: Heidi Spencer is a very pleasant 28 year old female who has a complex congenital heart disease history and was born with Tetralogy of  Fallot heart defect.  Since I initially saw her, I did obtain records from [facility] and her complex medical history is well outlined above.  The echo Doppler study that was most recently done here in [place], now suggests further increase in her pulmonary artery gradient to 79 mm which has significantly increased from 41 mm in January.  Recently, also had laboratory data reviewed.  This with her in detail.  CBC was normal with a hemoglobin of 12.9, hematocrit 39.8.  TSH was normal at 2.06.  Comprehensive metabolic panel was all normal.  Her renal function was excellent with a BUN of 10 and creatinine 0.69.  Vitamin D level was normal at 45.  Lipid status was excellent with a total cholesterol 114 triglycerides 39, HDL 43, LDL 63.  I had a long discussion with Heidi Spencer.  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  For this reason, I am recommending her to see Dr. Nolon Rod at [facility] whose name I was given by Dr. Antoine Poche for further evaluation and possible repeat surgery at [facility].  This appointment has been scheduled and she will be seeing him sometime in October.  Presently, she denies any chest pain.  She does note mild shortness of breath with activity.  She is unaware of any palpitations or arrhythmia.   Heidi Bihari, MD, [facility] 05/06/2014 12:12 PM

## 2014-05-06 NOTE — Patient Instructions (Signed)
You have been referred to Dr. Nolon Rod @ [facility].   Your physician wants you to follow-up in: 6 months. You will receive a reminder letter in the mail two months in advance. If you don't receive a letter, please call our office to schedule the follow-up appointment.

## 2014-05-08 ENCOUNTER — Encounter: Payer: Self-pay | Admitting: Cardiovascular Disease

## 2014-05-15 NOTE — Addendum Note (Signed)
Addended byGaynelle Cage. on: 05/15/2014 02:03 PM   Modules accepted: Orders

## 2014-06-14 ENCOUNTER — Ambulatory Visit (INDEPENDENT_AMBULATORY_CARE_PROVIDER_SITE_OTHER): Payer: BC Managed Care – PPO | Admitting: Family Medicine

## 2014-06-14 ENCOUNTER — Encounter: Payer: Self-pay | Admitting: Family Medicine

## 2014-06-14 VITALS — BP 104/67 | HR 82 | Wt 142.0 lb

## 2014-06-14 DIAGNOSIS — F411 Generalized anxiety disorder: Secondary | ICD-10-CM

## 2014-06-14 DIAGNOSIS — Q213 Tetralogy of Fallot: Secondary | ICD-10-CM | POA: Diagnosis not present

## 2014-06-14 MED ORDER — VENLAFAXINE HCL ER 75 MG PO CP24: 75.0000 mg | ORAL_CAPSULE | Freq: Every day | ORAL | Status: DC

## 2014-06-14 NOTE — Progress Notes (Signed)
CC: Heidi Spencer is a 28 y.o. female is here for f/u lexapro   Subjective: HPI:  Followup anxiety: Continues to take Lexapro 5 mg daily. She reports that she's only had a mild improvement with nervousness. She continues to express nervousness about all aspects of life and worrying about upcoming events only to find out that there was absolutely nothing to be worried about. Symptoms are moderate in severity occurring on a daily basis and interfering with her quality of life. She denies any substance abuse or alcohol use. Denies any depression or any other mental disturbance. Denies any intolerance to Lexapro. No thoughts wanting to harm herself or others. Review of systems is positive for fatigue without any sleep disturbance nor shortness of breath   Review Of Systems Outlined In HPI  Past Medical History  Diagnosis Date  . TOF (tetralogy of Fallot)     Past Surgical History  Procedure Laterality Date  . Cardiac surgery      x3 [age]yr, 28 yrs old and 28 yrs old   Family History  Problem Relation Age of Onset  . Crohn's disease Father   . Congestive Heart Failure Paternal Grandfather   . Depression      History   Social History  . Marital Status: Single    Spouse Name: N/A    Number of Children: N/A  . Years of Education: N/A   Occupational History  . Not on file.   Social History Main Topics  . Smoking status: Never Smoker   . Smokeless tobacco: Never Used  . Alcohol Use: Yes  . Drug Use: No  . Sexual Activity: Not on file   Other Topics Concern  . Not on file   Social History Narrative  . No narrative on file     Objective: BP 104/67  Pulse 82  Wt 142 lb (64.411 kg)  Vital signs reviewed. General: Alert and Oriented, No Acute Distress HEENT: Pupils equal, round, reactive to light. Conjunctivae clear.  External ears unremarkable.  Moist mucous membranes. Lungs: Clear and comfortable work of breathing, speaking in full sentences without accessory muscle  use. Cardiac: Regular rate and rhythm.  Neuro: CN II-XII grossly intact, gait normal. Extremities: No peripheral edema.  Strong peripheral pulses.  Mental Status: No depression, anxiety, nor agitation. Logical though process. Skin: Warm and dry. Assessment & Plan: [place] was seen today for f/u lexapro.  Diagnoses and associated orders for this visit:  Generalized anxiety disorder - venlafaxine XR (EFFEXOR-XR) 75 MG 24 hr capsule; Take 1 capsule (75 mg total) by mouth daily with breakfast.  Tetralogy of Fallot    Generalized anxiety disorder: Uncontrolled given element of fatigue stop Lexapro and switch to Effexor. If no better in one month followup at that time otherwise push follow up to 3 months Tetralogy of Fallot: She is [facility] between [facility] and [facility]  Return in about 3 months (around 09/14/2014).

## 2014-07-29 ENCOUNTER — Ambulatory Visit: Payer: BC Managed Care – PPO | Admitting: Family Medicine

## 2014-07-29 DIAGNOSIS — Z0289 Encounter for other administrative examinations: Secondary | ICD-10-CM

## 2014-09-12 ENCOUNTER — Other Ambulatory Visit: Payer: Self-pay | Admitting: Family Medicine

## 2014-09-16 ENCOUNTER — Other Ambulatory Visit: Payer: Self-pay

## 2014-09-16 DIAGNOSIS — F411 Generalized anxiety disorder: Secondary | ICD-10-CM

## 2014-09-16 MED ORDER — VENLAFAXINE HCL ER 75 MG PO CP24: 75.0000 mg | ORAL_CAPSULE | Freq: Every day | ORAL | Status: DC

## 2014-09-27 ENCOUNTER — Encounter: Payer: Self-pay | Admitting: Family Medicine

## 2014-09-27 ENCOUNTER — Ambulatory Visit (INDEPENDENT_AMBULATORY_CARE_PROVIDER_SITE_OTHER): Payer: Self-pay | Admitting: Family Medicine

## 2014-09-27 VITALS — BP 106/70 | HR 86 | Wt 145.0 lb

## 2014-09-27 DIAGNOSIS — F411 Generalized anxiety disorder: Secondary | ICD-10-CM

## 2014-09-27 DIAGNOSIS — Q213 Tetralogy of Fallot: Secondary | ICD-10-CM

## 2014-09-27 MED ORDER — VENLAFAXINE HCL ER 37.5 MG PO CP24: 37.5000 mg | ORAL_CAPSULE | Freq: Every day | ORAL | Status: DC

## 2014-09-27 NOTE — Progress Notes (Signed)
CC: Heidi Spencer is a 29 y.o. female is here for f/u anxiety   Subjective: HPI:   Since starting Effexor she reports that she has no longer experienced the fatigue that she was experiencing on Lexapro. She has noticed that her ability to handle stress is greatly improved now. Things that used to overwhelm her with anxiety no longer a factor. She denies any sleep disturbance, depression, nor any other mental disturbance. She denies any known side effects. She is concerned that maybe the medication is working too well and causing her to be emotionally blunted. She is going through a separation right now and she can't understand why she is not more emotional about it. Overall she's happy with the medication however feels like she is not experiencing dynamic emotions like a normal person. No thoughts of wanting to harm herself or others  She has pulmonary [facility].   Review Of Systems Outlined In HPI  Past Medical History  Diagnosis Date  . TOF (tetralogy of Fallot)     Past Surgical History  Procedure Laterality Date  . Cardiac surgery      x3 [age]yr, 29 yrs old and 29 yrs old   Family History  Problem Relation Age of Onset  . Crohn's disease Father   . Congestive Heart Failure Paternal Grandfather   . Depression      History   Social History  . Marital Status: Single    Spouse Name: N/A    Number of Children: N/A  . Years of Education: N/A   Occupational History  . Not on file.   Social History Main Topics  . Smoking status: Never Smoker   . Smokeless tobacco: Never Used  . Alcohol Use: Yes  . Drug Use: No  . Sexual Activity: Not on file   Other Topics Concern  . Not on file   Social History Narrative     Objective: BP 106/70 mmHg  Pulse 86  Wt 145 lb (65.772 kg)  Vital signs reviewed. General: Alert and Oriented, No Acute Distress HEENT: Pupils equal, round, reactive to light. Conjunctivae clear.  External ears  unremarkable.  Moist mucous membranes. Lungs: Clear and comfortable work of breathing, speaking in full sentences without accessory muscle use. Cardiac: Regular rate and rhythm.  Neuro: CN II-XII grossly intact, gait normal. Extremities: No peripheral edema.  Strong peripheral pulses.  Mental Status: No depression, anxiety, nor agitation. Logical though process. Skin: Warm and dry.  Assessment & Plan: Heidi Spencer was seen today for f/u anxiety.  Diagnoses and associated orders for this visit:  Tetralogy of Fallot  Generalized anxiety disorder - venlafaxine XR (EFFEXOR XR) 37.5 MG 24 hr capsule; Take 1 capsule (37.5 mg total) by mouth daily with breakfast. Replaces 75mg  formulation.    Generalized anxiety disorder: improved on Effexor, will decrease the dose to 37.5 mg since she is having some blunting of her emotions. Discussed that if she feels like she is regaining some dynamics to her emotional state however anxiety and nervousness comes back to an intolerable degree she can go directly back to 75 mg of Effexor and should call me for a new prescription at her prior dose.  Return in about 3 months (around 12/26/2014) for Mood follow up..Marland Kitchen

## 2014-10-17 ENCOUNTER — Other Ambulatory Visit: Payer: Self-pay | Admitting: Family Medicine

## 2014-10-18 ENCOUNTER — Telehealth: Payer: Self-pay | Admitting: *Deleted

## 2014-10-18 NOTE — Telephone Encounter (Signed)
Called patient to verify she received refill notification for 75mg  Effexor. LMOM

## 2014-11-29 ENCOUNTER — Ambulatory Visit: Payer: BLUE CROSS/BLUE SHIELD | Admitting: Physician Assistant

## 2014-12-16 ENCOUNTER — Encounter: Payer: Self-pay | Admitting: *Deleted

## 2014-12-16 ENCOUNTER — Emergency Department
Admission: EM | Admit: 2014-12-16 | Discharge: 2014-12-16 | Disposition: A | Discharge status: Home / Self Care | Payer: BLUE CROSS/BLUE SHIELD | Source: Home / Self Care | Attending: Family Medicine | Admitting: Family Medicine

## 2014-12-16 DIAGNOSIS — J209 Acute bronchitis, unspecified: Secondary | ICD-10-CM

## 2014-12-16 MED ORDER — DOXYCYCLINE MONOHYDRATE 25 MG/5ML PO SUSR: ORAL | Status: DC

## 2014-12-16 NOTE — Discharge Instructions (Signed)
Take plain guaifenesin (such as Robitussin syrup) with plenty of water every four hours, for cough and congestion.   Get adequate rest.   May use Afrin nasal spray (or generic oxymetazoline) twice daily for about 5 days.  Also recommend using saline nasal spray several times daily and saline nasal irrigation (AYR is a common brand).  Use Flonase nasal spray each morning after using Afrin nasal spray and saline nasal irrigation. Try warm salt water gargles for sore throat.  Stop all antihistamines for now, and other non-prescription cough/cold preparations. May take Delsym Cough Suppressant at bedtime for nighttime cough.  Follow-up with family doctor if not improving about 7 to 10 days.

## 2014-12-16 NOTE — ED Notes (Signed)
Pt c/o LT ear pain and pooping with a dry cough x 9 days. Denies fever.

## 2014-12-16 NOTE — ED Provider Notes (Signed)
CSN: [id number]     Arrival date & time 12/16/14  [id number] History   First MD Initiated Contact with Patient 12/16/14 1049     Chief Complaint  Patient presents with  . Cough  . Otalgia      HPI Comments: About two weeks ago patient developed rhinorrhea and left ear congestion but did not feel ill.  About 8 or 9 days ago she developed sudden dizziness (now improved), increased sinus congestion, left earache, and sore throat (now improved).  She now has a persistent cough, worse at night, with wheezing but no shortness of breath    The history is provided by the patient.    Past Medical History  Diagnosis Date  . TOF (tetralogy of Fallot)    Past Surgical History  Procedure Laterality Date  . Cardiac surgery      x3 [age]yr, 29 yrs old and 29 yrs old   Family History  Problem Relation Age of Onset  . Crohn's disease Father   . Congestive Heart Failure Paternal Grandfather   . Depression     History  Substance Use Topics  . Smoking status: Never Smoker   . Smokeless tobacco: Never Used  . Alcohol Use: Yes     Comment: rarely   OB History    No data available     Review of Systems + sore throat + hoarse + cough No pleuritic pain + wheezing + nasal congestion + post-nasal drainage No sinus pain/pressure No itchy/red eyes + left Earache + intermittent dizziness No hemoptysis No SOB No fever/chills No nausea No vomiting No abdominal pain No diarrhea No urinary symptoms No skin rash + fatigue No myalgias + headache Used OTC meds without relief  Allergies  Codeine and Vicodin  Home Medications   Prior to Admission medications   Medication Sig Start Date End Date Taking? Authorizing Provider  doxycycline (VIBRAMYCIN) 25 MG/5ML SUSR Take 20mL by mouth every 12 hours.  Take with food. 12/16/14   Lattie HawStephen A Dina Mobley, MD  metoprolol succinate (TOPROL-XL) 25 MG 24 hr tablet Take 25 mg by mouth. 07/17/14 07/17/15  Historical Provider, MD  venlafaxine XR (EFFEXOR XR) 37.5  MG 24 hr capsule Take 1 capsule (37.5 mg total) by mouth daily with breakfast. Replaces 75mg  formulation. 09/27/14   Laren BoomSean Hommel, DO  venlafaxine XR (EFFEXOR-XR) 75 MG 24 hr capsule TAKE ONE CAPSULE BY MOUTH ONE TIME DAILY WITH BREAKFAST  10/18/14   Sean Hommel, DO   BP 93/59 mmHg  Pulse 82  Temp(Src) 98.5 F (36.9 C) (Oral)  Resp 16  Ht 5\' 2"  (1.575 m)  Wt 147 lb (66.679 kg)  BMI 26.88 kg/m2  SpO2 98% Physical Exam Nursing notes and Vital Signs reviewed. Appearance:  Patient appears stated age, and in no acute distress Eyes:  Pupils are equal, round, and reactive to light and accomodation.  Extraocular movement is intact.  Conjunctivae are not inflamed  Ears:  Canals normal.  Tympanic membranes normal.  Nose:  Mildly congested turbinates.  No sinus tenderness.   Pharynx:  Normal Neck:  Supple.   Shotty nontender posterior nodes are palpated bilaterally  Lungs:  Clear to auscultation.  Breath sounds are equal.  Heart:  Regular rate and rhythm without murmurs, rubs, or gallops.  Abdomen:  Nontender without masses or hepatosplenomegaly.  Bowel sounds are present.  No CVA or flank tenderness.  Extremities:  No edema.  No calf tenderness Skin:  No rash present.   ED Course  Procedures  None  Labs Reviewed -  Tympanogram:  Left ear normal; Right ear positive peak pressure.       MDM   1. Acute bronchitis, unspecified organism    Begin doxycycline for atypical coverage. Take plain guaifenesin (such as Robitussin syrup) with plenty of water every four hours, for cough and congestion.   Get adequate rest.   May use Afrin nasal spray (or generic oxymetazoline) twice daily for about 5 days.  Also recommend using saline nasal spray several times daily and saline nasal irrigation (AYR is a common brand).  Use Flonase nasal spray each morning after using Afrin nasal spray and saline nasal irrigation. Try warm salt water gargles for sore throat.  Stop all antihistamines for now, and other  non-prescription cough/cold preparations. May take Delsym Cough Suppressant at bedtime for nighttime cough.  Follow-up with family doctor if not improving about 7 to 10 days.     Lattie Haw, MD 12/20/14 [phone]

## 2015-03-21 ENCOUNTER — Ambulatory Visit: Payer: BLUE CROSS/BLUE SHIELD | Admitting: Family Medicine

## 2015-03-28 ENCOUNTER — Ambulatory Visit (INDEPENDENT_AMBULATORY_CARE_PROVIDER_SITE_OTHER): Payer: BLUE CROSS/BLUE SHIELD | Admitting: Family Medicine

## 2015-03-28 ENCOUNTER — Encounter: Payer: Self-pay | Admitting: Family Medicine

## 2015-03-28 VITALS — BP 109/66 | HR 90 | Wt 139.0 lb

## 2015-03-28 DIAGNOSIS — F329 Major depressive disorder, single episode, unspecified: Secondary | ICD-10-CM | POA: Diagnosis not present

## 2015-03-28 DIAGNOSIS — F32A Depression, unspecified: Secondary | ICD-10-CM

## 2015-03-28 MED ORDER — FLUOXETINE HCL 20 MG PO TABS: 20.0000 mg | ORAL_TABLET | Freq: Every day | ORAL | Status: DC

## 2015-03-28 NOTE — Progress Notes (Signed)
CC: Heidi Spencer is a 29 y.o. female is here for f/u anxiety   Subjective: HPI:  Complains of moodiness, some irritability, and subjective depression ever since April of this year. She feels that symptoms are heavily influenced by the recent incarceration of her ex-husband in her ex-husband's family trying to get full custody of her children. She denies any difficulty with caring for her children emotionally or physically. She is losing interest old hobbies. She gets pleasure of taking care of her children but is finding little else that brings pleasure to her life. She denies thoughts of wanting to harm herself or others. Denies hallucinations. States that anxiety is not really a big issue right now and she wants to know if there is another medication that might be better for focusing on depression. 75 mg of Effexor or higher made her poorly motivated at work and gave her too much of a "I don't care" attitude.  Review Of Systems Outlined In HPI  Past Medical History  Diagnosis Date  . TOF (tetralogy of Fallot)     Past Surgical History  Procedure Laterality Date  . Cardiac surgery      x3 86yr, 29 yrs old and 29 yrs old   Family History  Problem Relation Age of Onset  . Crohn's disease Father   . Congestive Heart Failure Paternal Grandfather   . Depression      History   Social History  . Marital Status: Single    Spouse Name: N/A  . Number of Children: N/A  . Years of Education: N/A   Occupational History  . Not on file.   Social History Main Topics  . Smoking status: Never Smoker   . Smokeless tobacco: Never Used  . Alcohol Use: Yes     Comment: rarely  . Drug Use: No  . Sexual Activity: Not on file   Other Topics Concern  . Not on file   Social History Narrative     Objective: BP 109/66 mmHg  Pulse 90  Wt 139 lb (63.05 kg)  Vital signs reviewed. General: Alert and Oriented, No Acute Distress HEENT: Pupils equal, round, reactive to light. Conjunctivae  clear.  External ears unremarkable.  Moist mucous membranes. Lungs: Clear and comfortable work of breathing, speaking in full sentences without accessory muscle use. Cardiac: Regular rate and rhythm.  Neuro: CN II-XII grossly intact, gait normal. Extremities: No peripheral edema.  Strong peripheral pulses.  Mental Status: No depression, anxiety, nor agitation. Logical though process. Skin: Warm and dry.  Assessment & Plan: Heidi Spencer was seen today for f/u anxiety.  Diagnoses and all orders for this visit:  Depression Orders: -     FLUoxetine (PROZAC) 20 MG tablet; Take 1 tablet (20 mg total) by mouth daily.   Depression:She no longer seems to be expressing any signs of anxiety, discussed that it is certainly understandable given circumstances that she is under right now. I also let her know that if she needs a letter for her attorney I be happy to provide one that states that I know of no psychological or medical reasons that would impair her from being a good mother. I've seen her with her children during most of these visits and it seems like they have extremely good dynamics between each other. Stopping Effexor, begin Prozac.  25 minutes spent face-to-face during visit today of which at least 50% was counseling or coordinating care regarding: 1. Depression       Return in about 3 months (around  06/28/2015).  

## 2015-04-11 ENCOUNTER — Ambulatory Visit (INDEPENDENT_AMBULATORY_CARE_PROVIDER_SITE_OTHER): Payer: BLUE CROSS/BLUE SHIELD | Admitting: Osteopathic Medicine

## 2015-04-11 ENCOUNTER — Encounter: Payer: Self-pay | Admitting: Osteopathic Medicine

## 2015-04-11 VITALS — BP 100/66 | HR 78 | Temp 98.1°F | Ht 62.0 in | Wt 140.0 lb

## 2015-04-11 DIAGNOSIS — J019 Acute sinusitis, unspecified: Secondary | ICD-10-CM | POA: Diagnosis not present

## 2015-04-11 DIAGNOSIS — R059 Cough, unspecified: Secondary | ICD-10-CM

## 2015-04-11 DIAGNOSIS — R05 Cough: Secondary | ICD-10-CM

## 2015-04-11 MED ORDER — FLUTICASONE PROPIONATE 50 MCG/ACT NA SUSP: 2.0000 | Freq: Every day | NASAL | Status: AC

## 2015-04-11 MED ORDER — OXYMETAZOLINE HCL 0.05 % NA SOLN: 1.0000 | Freq: Two times a day (BID) | NASAL | Status: DC

## 2015-04-11 MED ORDER — CEFDINIR 250 MG/5ML PO SUSR: 300.0000 mg | Freq: Two times a day (BID) | ORAL | Status: DC

## 2015-04-11 NOTE — Progress Notes (Signed)
HPI: Heidi Spencer is a 29 y.o. female who presents to [facility]  today for sinus pressure and congestion going on for a few weeks. Seems to be getting worse. Has been taking Sudafed which helps somewhat, has been coughing up mucus material, no blood. (+) Ear pain and eye discomfort, no vision changes.    Past medical, social and family history reviewed: Past Medical History  Diagnosis Date  . TOF (tetralogy of Fallot)    Past Surgical History  Procedure Laterality Date  . Cardiac surgery      x3 41yr, 29 yrs old and 29 yrs old   Social History  Substance Use Topics  . Smoking status: Never Smoker   . Smokeless tobacco: Never Used  . Alcohol Use: Yes     Comment: rarely   Family History  Problem Relation Age of Onset  . Crohn's disease Father   . Congestive Heart Failure Paternal Grandfather   . Depression      Current Outpatient Prescriptions  Medication Sig Dispense Refill  . aspirin 81 MG tablet Take 81 mg by mouth daily.    . drospirenone-ethinyl estradiol (YAZ,GIANVI,LORYNA) 3-0.02 MG tablet Take 1 tablet by mouth daily.    Marland Kitchen FLUoxetine (PROZAC) 20 MG tablet Take 1 tablet (20 mg total) by mouth daily. 30 tablet 3  . metoprolol succinate (TOPROL-XL) 25 MG 24 hr tablet Take 25 mg by mouth.    .    0  .    2  .    0   No current facility-administered medications for this visit.   Allergies  Allergen Reactions  . Codeine   . Vicodin [Hydrocodone-Acetaminophen]     Pt states it makes her breathing irregular     Review of Systems: CONSTITUTIONAL: Neg fever/chills, no unintentional weight changes HEAD/EYES/EARS/NOSE: (+) sinus headache/no vision change or hearing change or ear sicahrge CARDIAC: No chest pain/pressure/palpitations, no orthopnea RESPIRATORY: No shortness of breath/wheeze. (+) occasional coughing, occasionally productive GASTROINTESTINAL: Mild nausea/ novomiting/abdominal pain/blood in  stool/diarrhea/constipation MUSCULOSKELETAL: No myalgia/arthralgia  Exam:  BP 100/66 mmHg  Pulse 78  Temp(Src) 98.1 F (36.7 C) (Oral)  Ht  (1.575 m)  Wt 140 lb (63.504 kg)  BMI 25.60 kg/m2 Constitutional: VSS, see above. General Appearance: alert, well-developed, well-nourished, NAD Eyes: Normal lids and conjunctive, non-icteric sclera, PERRLA Ears, Nose, Mouth, Throat: Normal external inspection ears/nares/mouth/lips/gums, Normal TM bilaterally, MMM, posterior pharynx without erythema/exudate, nares (+) swelling, mild erythema, clear rhinorrhea Neck: No masses, trachea midline. No thyroid enlargement/tenderness/mass appreciated Respiratory: Normal respiratory effort. No dullness/hyper-resonance to percussion. Breath sounds normal, no wheeze/rhonchi/rales Cardiovascular: S1/S2 with click from artificial valve and systolic murmur present.   No results found for this or any previous visit (from the past 72 hour(s)). No results found.   ASSESSMENT/PLAN:  Acute sinusitis, recurrence not specified, unspecified location - Plan: cefdinir (OMNICEF) 250 MG/5ML suspension, oxymetazoline (AFRIN NASAL SPRAY) 0.05 % nasal spray, fluticasone (FLONASE) 50 MCG/ACT nasal spray  Cough - OTC lozenges/honey recommended   RTC if no improvement or if other concerns.

## 2015-04-11 NOTE — Patient Instructions (Signed)

## 2015-04-16 ENCOUNTER — Ambulatory Visit (INDEPENDENT_AMBULATORY_CARE_PROVIDER_SITE_OTHER): Payer: BLUE CROSS/BLUE SHIELD | Admitting: Family Medicine

## 2015-04-16 ENCOUNTER — Encounter: Payer: Self-pay | Admitting: Family Medicine

## 2015-04-16 VITALS — BP 104/69 | HR 79 | Temp 98.2°F | Wt 140.0 lb

## 2015-04-16 DIAGNOSIS — H6692 Otitis media, unspecified, left ear: Secondary | ICD-10-CM | POA: Diagnosis not present

## 2015-04-16 DIAGNOSIS — J019 Acute sinusitis, unspecified: Secondary | ICD-10-CM | POA: Diagnosis not present

## 2015-04-16 MED ORDER — PREDNISONE 20 MG PO TABS: ORAL_TABLET | ORAL | Status: AC

## 2015-04-16 MED ORDER — CEFDINIR 250 MG/5ML PO SUSR: 300.0000 mg | Freq: Two times a day (BID) | ORAL | Status: DC

## 2015-04-16 NOTE — Progress Notes (Signed)
CC: Heidi Spencer is a 29 y.o. female is here for Ear Pain   Subjective: HPI:  Complains of worsening ear pain despite being on Omnicef. She's been on Omnicef for 4 days now and it seems to be helping with a cough, facial pressure and nasal congestion. Left ear pain is described as a fullness, mild decrease in hearing. No benefit from Afrin or over-the-counter allergy medicine. Symptoms are moderate in severity and worsening on a daily basis. She's had some chills over the weekend. She denies fevers. She denies any other motor or sensory disturbances or headache or rash. No fevers.   Review Of Systems Outlined In HPI  Past Medical History  Diagnosis Date  . TOF (tetralogy of Fallot)     Past Surgical History  Procedure Laterality Date  . Cardiac surgery      x3 71yr, 29 yrs old and 29 yrs old   Family History  Problem Relation Age of Onset  . Crohn's disease Father   . Congestive Heart Failure Paternal Grandfather   . Depression      Social History   Social History  . Marital Status: Single    Spouse Name: N/A  . Number of Children: N/A  . Years of Education: N/A   Occupational History  . Not on file.   Social History Main Topics  . Smoking status: Never Smoker   . Smokeless tobacco: Never Used  . Alcohol Use: Yes     Comment: rarely  . Drug Use: No  . Sexual Activity: Not on file   Other Topics Concern  . Not on file   Social History Narrative     Objective: BP 104/69 mmHg  Pulse 79  Temp(Src) 98.2 F (36.8 C) (Oral)  Wt 140 lb (63.504 kg)  SpO2 97%  General: Alert and Oriented, No Acute Distress HEENT: Pupils equal, round, reactive to light. Conjunctivae clear.  External ears unremarkable, canals clear with intact TMs. Right middle ears open without effusion, left middle ear is filled with opaque fluid with a red bulging eardrum.. Pink inferior turbinates.  Moist mucous membranes, pharynx without inflammation nor lesions.  Neck supple without palpable  lymphadenopathy nor abnormal masses. Lungs: Clear to auscultation bilaterally, no wheezing/ronchi/rales.  Comfortable work of breathing. Good air movement. Extremities: No peripheral edema.  Strong peripheral pulses.  Mental Status: No depression, anxiety, nor agitation. Skin: Warm and dry.  Assessment & Plan: Heidi Spencer was seen today for ear pain.  Diagnoses and all orders for this visit:  Acute left otitis media, recurrence not specified, unspecified otitis media type -     predniSONE (DELTASONE) 20 MG tablet; Three tabs daily days 1-3, two tabs daily days 4-6, one tab daily days 7-9, half tab daily days 10-13.  Acute sinusitis, recurrence not specified, unspecified location -     cefdinir (OMNICEF) 250 MG/5ML suspension; Take 6 mLs (300 mg total) by mouth 2 (two) times daily.   Acute left otitis media not responding to cefdinir alone, I given her an extension of cefdinir for a full 10 days of treatment along with starting prednisone. Call if no better by Friday and if so we'll switch antibiotic  Return if symptoms worsen or fail to improve.

## 2015-06-24 ENCOUNTER — Telehealth: Payer: Self-pay

## 2015-06-24 DIAGNOSIS — F329 Major depressive disorder, single episode, unspecified: Secondary | ICD-10-CM

## 2015-06-24 DIAGNOSIS — F32A Depression, unspecified: Secondary | ICD-10-CM

## 2015-06-24 MED ORDER — FLUOXETINE HCL 20 MG PO TABS: 40.0000 mg | ORAL_TABLET | Freq: Every day | ORAL | Status: DC

## 2015-06-24 NOTE — Telephone Encounter (Signed)
Since it appeared to be working originally [employer]'ll send in a higher dose of 40mg  daily, RX sent [facility] [place].

## 2015-06-24 NOTE — Telephone Encounter (Signed)
Pt advised.

## 2015-06-24 NOTE — Telephone Encounter (Signed)
Pt called stating that Prozac was working well in the beginning but now she's not feeling like herself.  She stressed that she could not take time off work to come in for a visit but would like to know what do you suggest she do?

## 2015-07-02 ENCOUNTER — Telehealth: Payer: Self-pay

## 2015-07-02 NOTE — Telephone Encounter (Signed)
Pt advised.

## 2015-07-02 NOTE — Telephone Encounter (Signed)
Pt reports that she is currently experiencing memory loss.  She states that she can only remember things if she writes it down.  She wants to know is this a side effect from her medications or this because she's under a lot of stress?

## 2015-07-02 NOTE — Telephone Encounter (Signed)
It could be the prozac, try cutting it in half and see if this changes anything, if not after two weeks then follow appt recommended

## 2015-07-08 ENCOUNTER — Encounter: Payer: Self-pay | Admitting: Family Medicine

## 2015-07-08 ENCOUNTER — Ambulatory Visit (INDEPENDENT_AMBULATORY_CARE_PROVIDER_SITE_OTHER): Payer: BLUE CROSS/BLUE SHIELD | Admitting: Family Medicine

## 2015-07-08 VITALS — BP 93/56 | HR 73 | Wt 139.0 lb

## 2015-07-08 DIAGNOSIS — R300 Dysuria: Secondary | ICD-10-CM

## 2015-07-08 LAB — POCT URINALYSIS DIPSTICK
BILIRUBIN UA: NEGATIVE
Glucose, UA: NEGATIVE
Nitrite, UA: NEGATIVE
Protein, UA: 100
Urobilinogen, UA: 0.2
pH, UA: 5.5

## 2015-07-08 MED ORDER — SULFAMETHOXAZOLE-TRIMETHOPRIM 800-160 MG PO TABS: 1.0000 | ORAL_TABLET | Freq: Two times a day (BID) | ORAL | Status: DC

## 2015-07-08 NOTE — Progress Notes (Signed)
CC: Heidi Spencer is a 29 y.o. female is here for Urinary Tract Infection   Subjective: HPI:  Burning with urination, urinary frequency, urinary hesitancy along with pelvic cramping. She denies any other genitourinary complaints. She's never had the symptoms before. She is concerned that she might have urinary tract infection. She saw blood in her urine yesterday which prompted her to come in today. She had nauseousness yesterday but no fevers chills or flank pain.   Review Of Systems Outlined In HPI  Past Medical History  Diagnosis Date  . TOF (tetralogy of Fallot)     Past Surgical History  Procedure Laterality Date  . Cardiac surgery      x3 [age]yr, 29 yrs old and 29 yrs old   Family History  Problem Relation Age of Onset  . Crohn's disease Father   . Congestive Heart Failure Paternal Grandfather   . Depression      Social History   Social History  . Marital Status: Single    Spouse Name: N/A  . Number of Children: N/A  . Years of Education: N/A   Occupational History  . Not on file.   Social History Main Topics  . Smoking status: Never Smoker   . Smokeless tobacco: Never Used  . Alcohol Use: Yes     Comment: rarely  . Drug Use: No  . Sexual Activity: Not on file   Other Topics Concern  . Not on file   Social History Narrative     Objective: BP 93/56 mmHg  Pulse 73  Wt 139 lb (63.05 kg)  Vital signs reviewed. General: Alert and Oriented, No Acute Distress HEENT: Pupils equal, round, reactive to light. Conjunctivae clear.  External ears unremarkable.  Moist mucous membranes. Lungs: Clear and comfortable work of breathing, speaking in full sentences without accessory muscle use. Cardiac: Regular rate and rhythm.  Neuro: CN II-XII grossly intact, gait normal. Extremities: No peripheral edema.  Strong peripheral pulses.  Mental Status: No depression, anxiety, nor agitation. Logical though process. Skin: Warm and dry. No CVA tenderness  Assessment &  Plan: Hayleen was seen today for urinary tract infection.  Diagnoses and all orders for this visit:  Dysuria -     POCT urinalysis dipstick -     sulfamethoxazole-trimethoprim (BACTRIM DS,SEPTRA DS) 800-160 MG tablet; Take 1 tablet by mouth 2 (two) times daily. -     Urine Culture   Urinalysis and history suspicious for UTI therefore start Bactrim, will follow culture.Signs and symptoms requring emergent/urgent reevaluation were discussed with the patient.   Return if symptoms worsen or fail to improve.

## 2015-07-10 LAB — URINE CULTURE

## 2015-08-11 ENCOUNTER — Telehealth: Payer: Self-pay

## 2015-08-11 NOTE — Telephone Encounter (Signed)
Pt left a message stating that she have requested records [place] to [place] faxed to you.  She reports that the records is for her custody case and said that you'll understand.

## 2015-08-13 ENCOUNTER — Encounter: Payer: Self-pay | Admitting: Family Medicine

## 2015-08-13 DIAGNOSIS — E236 Other disorders of pituitary gland: Secondary | ICD-10-CM | POA: Insufficient documentation

## 2015-08-13 DIAGNOSIS — Z8639 Personal history of other endocrine, nutritional and metabolic disease: Secondary | ICD-10-CM | POA: Insufficient documentation

## 2015-08-29 ENCOUNTER — Telehealth: Payer: Self-pay

## 2015-08-29 NOTE — Telephone Encounter (Signed)
Pt called requesting that a letter be written for her lawyer informing the courts that you have no concerns with Heidi Spencer mental health.

## 2015-09-01 ENCOUNTER — Encounter: Payer: Self-pay | Admitting: Family Medicine

## 2015-09-01 NOTE — Telephone Encounter (Signed)
Letter in your in box. 

## 2015-09-01 NOTE — Telephone Encounter (Signed)
Pt.notified

## 2015-09-05 ENCOUNTER — Ambulatory Visit: Payer: BLUE CROSS/BLUE SHIELD | Admitting: Family Medicine

## 2015-09-08 ENCOUNTER — Encounter: Payer: Self-pay | Admitting: Family Medicine

## 2015-09-08 ENCOUNTER — Ambulatory Visit (INDEPENDENT_AMBULATORY_CARE_PROVIDER_SITE_OTHER): Payer: 59 | Admitting: Family Medicine

## 2015-09-08 VITALS — BP 103/68 | HR 69 | Wt 142.0 lb

## 2015-09-08 DIAGNOSIS — E039 Hypothyroidism, unspecified: Secondary | ICD-10-CM | POA: Diagnosis not present

## 2015-09-08 MED ORDER — METOPROLOL SUCCINATE ER 25 MG PO TB24: 25.0000 mg | ORAL_TABLET | Freq: Every day | ORAL | Status: AC

## 2015-09-08 NOTE — Progress Notes (Signed)
CC: Heidi Spencer is a 30 y.o. female is here for Hypothyroidism   Subjective: HPI:  Last week she was experiencing some breast itching and her gynecologist obtained a TSH which was mildly elevated. Her itching is now resolved. She has no personal or family history of thyroid abnormalities. She denies any unintentional weight loss or gain. There's been no gastrointestinal complaints or further skin complaints.   Review Of Systems Outlined In HPI  Past Medical History  Diagnosis Date  . TOF (tetralogy of Fallot)     Past Surgical History  Procedure Laterality Date  . Cardiac surgery      x3 [age]yr, 30 yrs old and 30 yrs old   Family History  Problem Relation Age of Onset  . Crohn's disease Father   . Congestive Heart Failure Paternal Grandfather   . Depression      Social History   Social History  . Marital Status: Single    Spouse Name: N/A  . Number of Children: N/A  . Years of Education: N/A   Occupational History  . Not on file.   Social History Main Topics  . Smoking status: Never Smoker   . Smokeless tobacco: Never Used  . Alcohol Use: Yes     Comment: rarely  . Drug Use: No  . Sexual Activity: Not on file   Other Topics Concern  . Not on file   Social History Narrative     Objective: BP 103/68 mmHg  Pulse 69  Wt 142 lb (64.411 kg)  Vital signs reviewed. General: Alert and Oriented, No Acute Distress HEENT: Pupils equal, round, reactive to light. Conjunctivae clear.  External ears unremarkable.  Moist mucous membranes. Lungs: Clear and comfortable work of breathing, speaking in full sentences without accessory muscle use. Cardiac: Regular rate and rhythm.  Neuro: CN II-XII grossly intact, gait normal. Extremities: No peripheral edema.  Strong peripheral pulses.  Mental Status: No depression, anxiety, nor agitation. Logical though process. Skin: Warm and dry. Assessment & Plan: Heidi Spencer was seen today for hypothyroidism.  Diagnoses and all orders  for this visit:  Hypothyroidism, unspecified hypothyroidism type -     TSH -     T4, free -     T3, free  Other orders -     metoprolol succinate (TOPROL-XL) 25 MG 24 hr tablet; Take 1 tablet (25 mg total) by mouth daily.   Repeat TSH to determine if this was an outlier or if she truly is showing signs of  Hypothyroidism.  No Follow-up on file.

## 2015-09-09 LAB — T3, FREE: T3, Free: 2.9 pg/mL (ref 2.3–4.2)

## 2015-09-09 LAB — TSH: TSH: 2.522 u[IU]/mL (ref 0.350–4.500)

## 2015-09-09 LAB — T4, FREE: FREE T4: 1.09 ng/dL (ref 0.80–1.80)

## 2015-09-23 ENCOUNTER — Encounter: Payer: Self-pay | Admitting: Family Medicine

## 2015-09-23 ENCOUNTER — Ambulatory Visit (INDEPENDENT_AMBULATORY_CARE_PROVIDER_SITE_OTHER): Payer: 59 | Admitting: Family Medicine

## 2015-09-23 VITALS — BP 103/68 | HR 80 | Wt 145.0 lb

## 2015-09-23 DIAGNOSIS — M25512 Pain in left shoulder: Secondary | ICD-10-CM

## 2015-09-23 DIAGNOSIS — R1084 Generalized abdominal pain: Secondary | ICD-10-CM | POA: Diagnosis not present
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
## 2015-09-24 ENCOUNTER — Telehealth: Payer: Self-pay | Admitting: Family Medicine

## 2015-09-24 ENCOUNTER — Ambulatory Visit (INDEPENDENT_AMBULATORY_CARE_PROVIDER_SITE_OTHER): Payer: 59

## 2015-09-24 DIAGNOSIS — R1084 Generalized abdominal pain: Secondary | ICD-10-CM | POA: Diagnosis not present

## 2015-09-24 LAB — COMPLETE METABOLIC PANEL WITH GFR
ALBUMIN: 3.6 g/dL (ref 3.6–5.1)
ALK PHOS: 66 U/L (ref 33–115)
ALT: 15 U/L (ref 6–29)
AST: 20 U/L (ref 10–30)
BILIRUBIN TOTAL: 0.4 mg/dL (ref 0.2–1.2)
BUN: 8 mg/dL (ref 7–25)
CALCIUM: 9.6 mg/dL (ref 8.6–10.2)
CHLORIDE: 105 mmol/L (ref 98–110)
CO2: 24 mmol/L (ref 20–31)
CREATININE: 0.68 mg/dL (ref 0.50–1.10)
GLUCOSE: 106 mg/dL — AB (ref 65–99)
POTASSIUM: 3.9 mmol/L (ref 3.5–5.3)
Sodium: 138 mmol/L (ref 135–146)
Total Protein: 6.9 g/dL (ref 6.1–8.1)

## 2015-09-24 LAB — GAMMA GT: GGT: 15 U/L (ref 7–51)

## 2015-09-24 LAB — LIPASE: Lipase: 27 U/L (ref 7–60)

## 2015-09-24 NOTE — Telephone Encounter (Signed)
Will you please let patient know that her gallbladder, liver test, and pancreatic test were all normal.  As discussed yesterday I'd recommend having an abdominal ultrasound performed to look for gallstones.  I'll place an order today, please let me know if not contacted about scheduling by tomorrow.

## 2015-09-24 NOTE — Telephone Encounter (Signed)
Pt advised.

## 2015-12-13 ENCOUNTER — Other Ambulatory Visit: Payer: Self-pay | Admitting: Family Medicine

## 2017-01-17 IMAGING — US US ABDOMEN COMPLETE
1 series · 14 of 25 positions shown · non-contrast
Comparison: None.

CLINICAL DATA: Postprandial abdominal bloating. History of
tetralogy of flow. Abdominal pain.

EXAM:
ABDOMEN ULTRASOUND COMPLETE

[Series 1: us abdomen complete · 0.22mm/px · 14 of 64 slices shown]
[im 1/64]
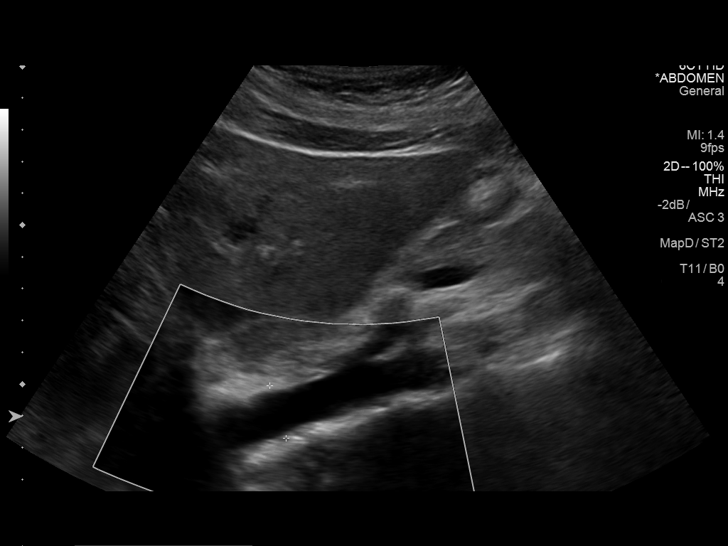
[im 6/64]
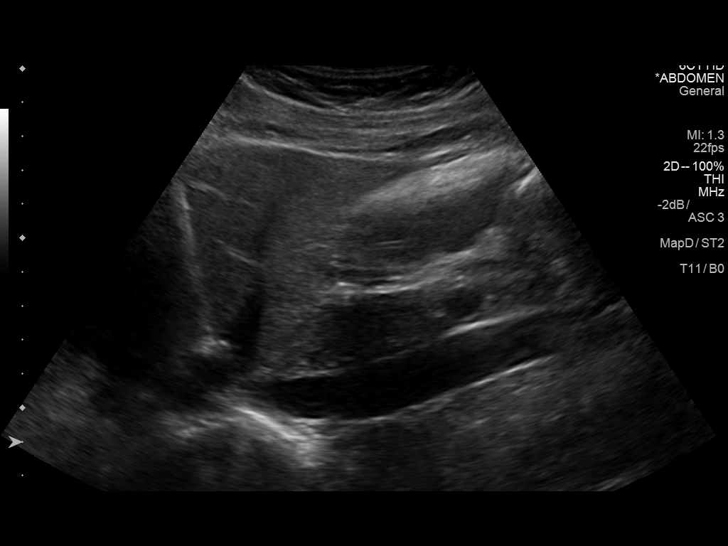
[im 11/64]
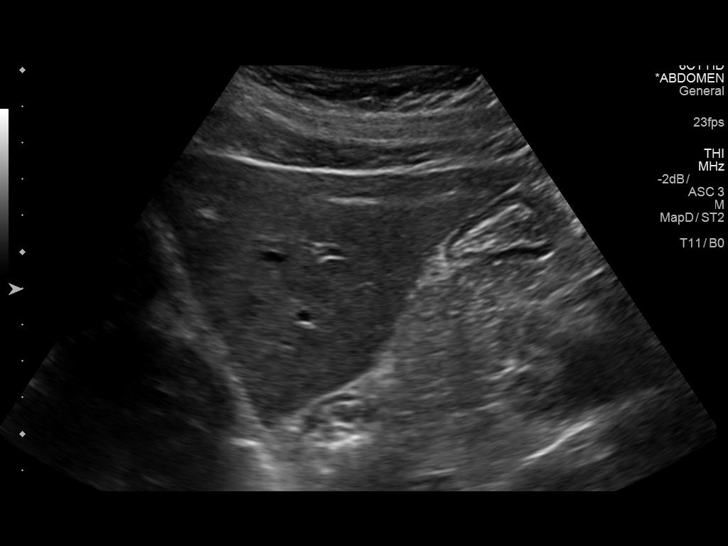
[im 16/64]
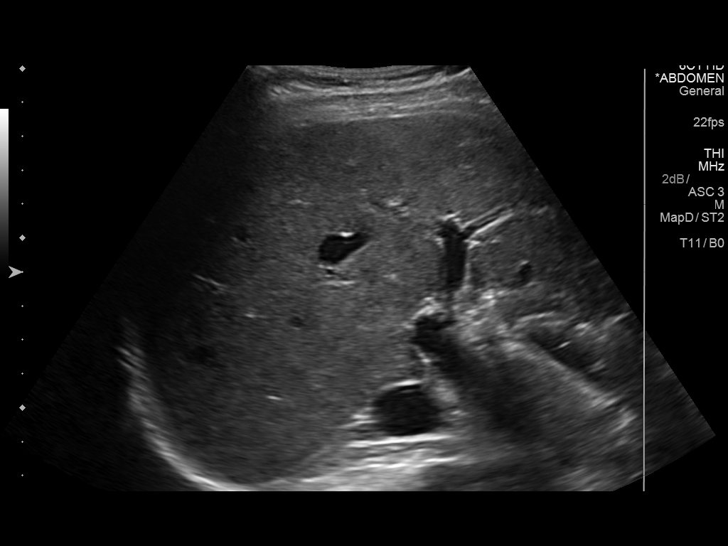
[im 22/64]
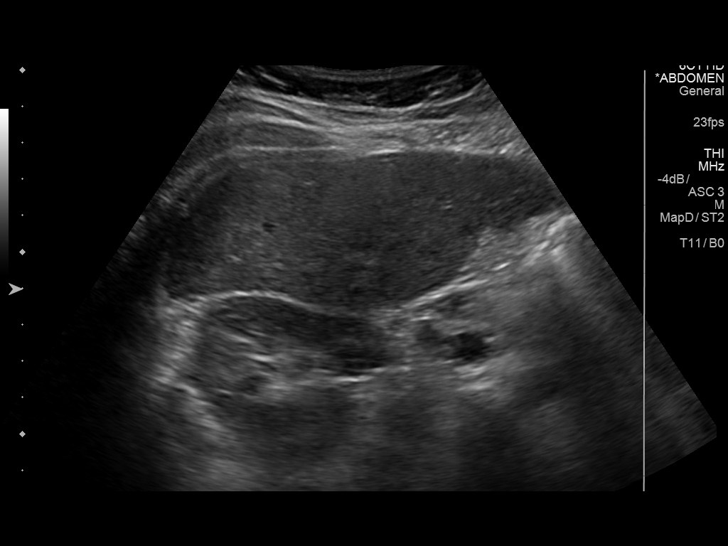
[im 24/64]
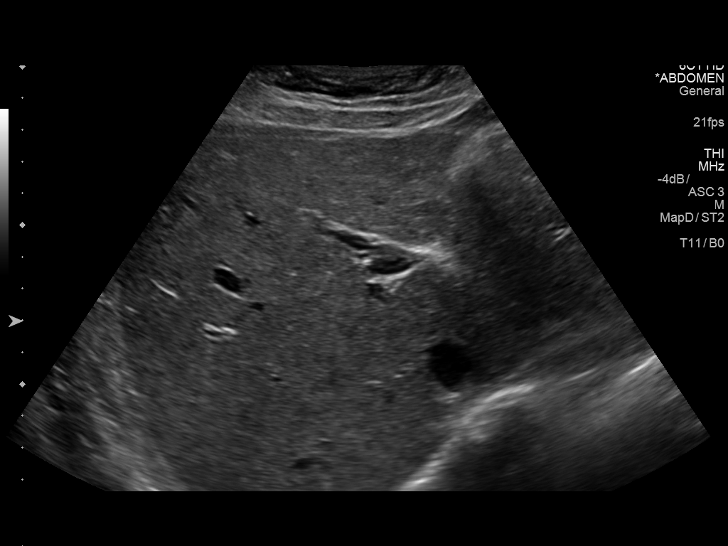
[im 29/64]
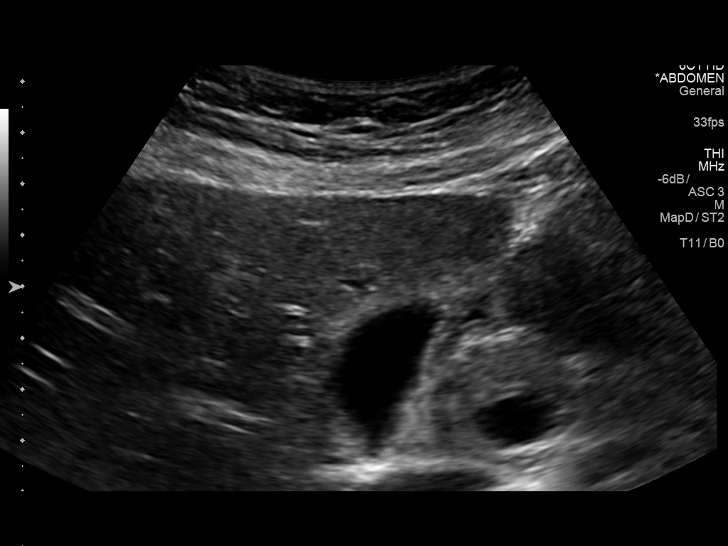
[im 35/64]
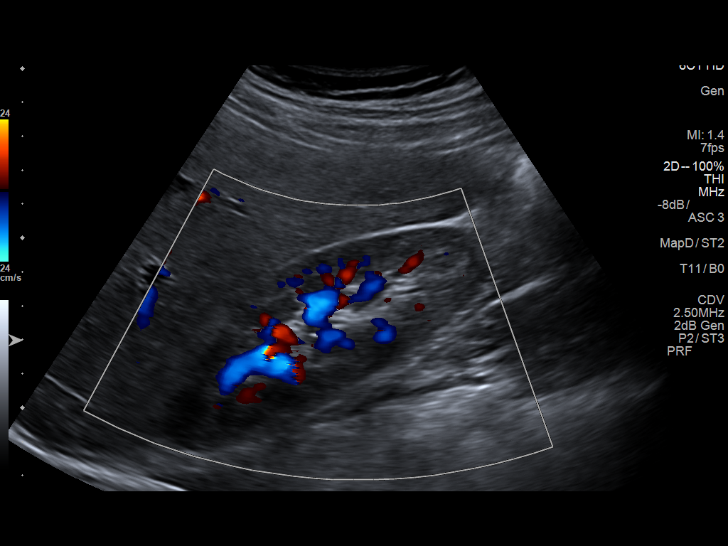
[im 40/64]
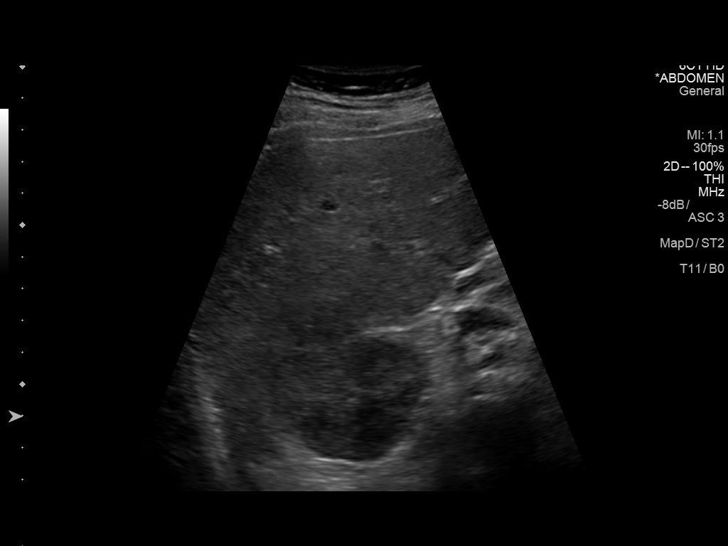
[im 43/64]
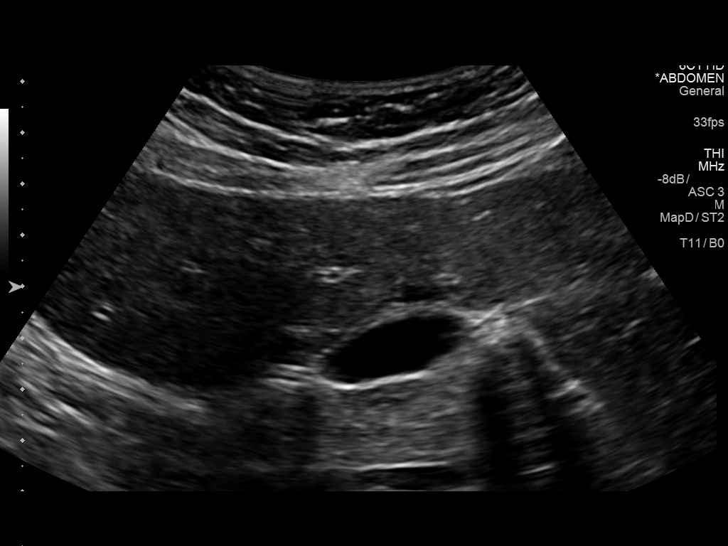
[im 48/64]
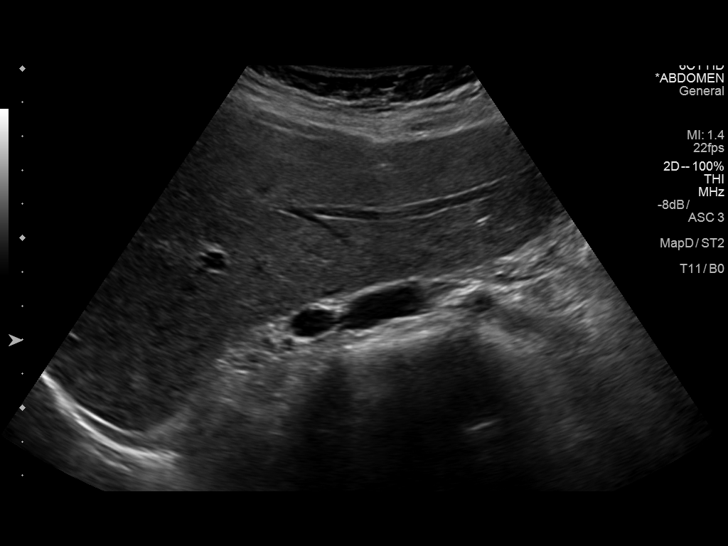
[im 53/64]
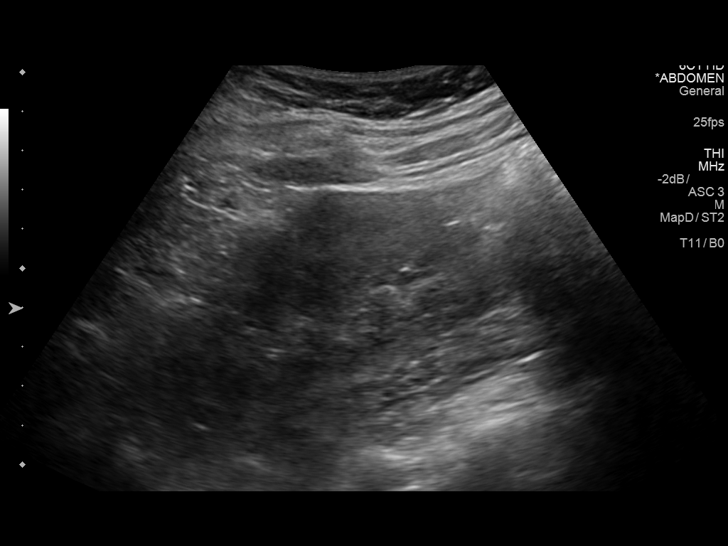
[im 58/64]
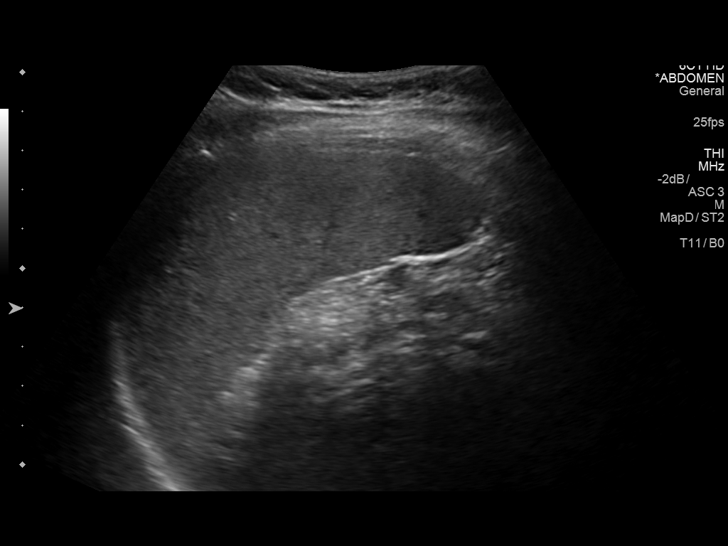
[im 64/64]
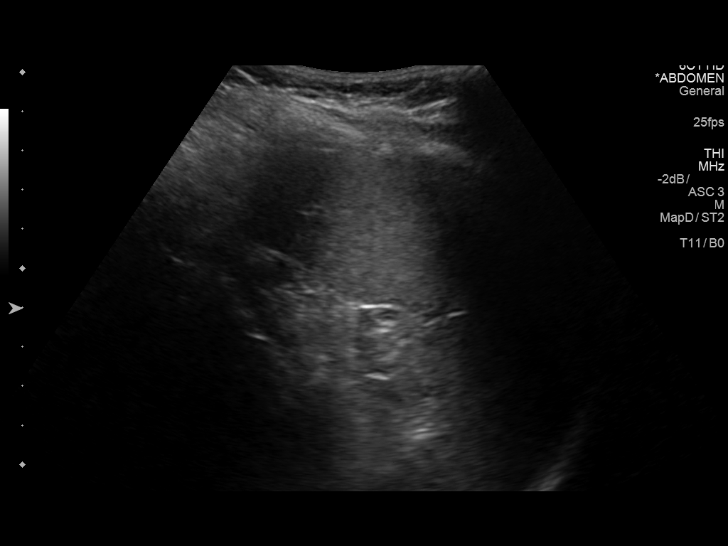

[14 of 25 positions shown; findings below may reference images not displayed]

FINDINGS: Gallbladder: No gallstones or wall thickening visualized. No
sonographic Murphy sign noted by sonographer. Incidental Phrygian
cap.

Common bile duct: Diameter: 3 mm

Liver: No focal lesion identified. Within normal limits in
parenchymal echogenicity.

IVC: No abnormality visualized. Some portions poorly seen due to
overlying bowel gas.

Pancreas: Visualized portion unremarkable.

Spleen: Size and appearance within normal limits.

Right Kidney: Length: 11.0 cm. Echogenicity within normal limits. No
mass or hydronephrosis visualized.

Left Kidney: Length: 10.9 cm. Echogenicity within normal limits. No
mass or hydronephrosis visualized.

Abdominal aorta: No aneurysm visualized. Some portions poorly seen
due to overlying bowel gas.

Other findings: None.
IMPRESSION: 1. No specific abnormality is identified. Portions of the IVC and
abdominal aorta were poorly seen due to overlying bowel gas.
